# Patient Record
Sex: Female | Born: 1995 | Race: White | Hispanic: No | Marital: Single | Smoking: Never smoker
Health system: Southern US, Community
[De-identification: ages and names within clinical notes are randomized; demographics above are authoritative.]

## PROBLEM LIST (undated history)

## (undated) HISTORY — PX: WISDOM TOOTH EXTRACTION: SHX21

## (undated) HISTORY — PX: TONSILLECTOMY AND ADENOIDECTOMY: SUR1326

---
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
## 2017-10-11 ENCOUNTER — Ambulatory Visit (INDEPENDENT_AMBULATORY_CARE_PROVIDER_SITE_OTHER): Payer: 59 | Admitting: Obstetrics and Gynecology

## 2017-10-11 ENCOUNTER — Encounter: Payer: Self-pay | Admitting: Obstetrics and Gynecology

## 2017-10-11 VITALS — BP 100/60 | HR 77 | Ht 67.0 in | Wt 164.0 lb

## 2017-10-11 DIAGNOSIS — T8332XA Displacement of intrauterine contraceptive device, initial encounter: Secondary | ICD-10-CM | POA: Diagnosis not present

## 2017-10-11 LAB — POCT URINE PREGNANCY: Preg Test, Ur: NEGATIVE

## 2017-10-11 NOTE — Progress Notes (Signed)
  History of Present Illness:  Mckenzie Randolph is a 22 y.o. that had a IUD placed approximately 2 years ago. She has experience amenorrhea since that time. She was recently tested for STDs and the person performing her pelvic exam did not see the strings of the IUD. She was referred to find the strings of the IUD.  PMHx: She  has no past medical history on file. Also,  has a past surgical history that includes Wisdom tooth extraction., family history includes Cancer in her maternal grandfather; Heart Problems in her paternal grandfather.,  reports that  has never smoked. she has never used smokeless tobacco. She reports that she drinks alcohol. She reports that she does not use drugs. No outpatient medications have been marked as taking for the 10/11/17 encounter (Office Visit) with Mckenzie Randolph, Mckenzie Roskelley R, MD.  .  Also, has No Known Allergies..  Review of Systems  Constitutional: Negative for chills, fever, malaise/fatigue and weight loss.  HENT: Negative for congestion, hearing loss and sinus pain.   Eyes: Negative for blurred vision and double vision.  Respiratory: Negative for cough, sputum production, shortness of breath and wheezing.   Cardiovascular: Negative for chest pain, palpitations, orthopnea and leg swelling.  Gastrointestinal: Negative for abdominal pain, constipation, diarrhea, nausea and vomiting.  Genitourinary: Negative for dysuria, flank pain, frequency, hematuria and urgency.  Musculoskeletal: Negative for back pain, falls and joint pain.  Skin: Negative for itching and rash.  Neurological: Negative for dizziness and headaches.  Psychiatric/Behavioral: Negative for depression, substance abuse and suicidal ideas. The patient is not nervous/anxious.     Physical Exam:  BP 100/60   Pulse 77   Ht 5\' 7"  (1.702 m)   Wt 164 lb (74.4 kg)   LMP 09/25/2017   BMI 25.69 kg/m  Body mass index is 25.69 kg/m. Constitutional: Well nourished, well developed female in no acute distress.   Abdomen: diffusely non tender to palpation, non distended, and no masses, hernias Neuro: Grossly intact Psych:  Normal mood and affect.   Pelvic exam:  Two IUD strings absent from the cervical os. Could not tease strings with cytobrush.  EGBUS, vaginal vault and cervix: within normal limits  Assessment: IUD strings present in proper location; pt doing well  Plan: Urine pregnancy test today in office negative Will have patient return in 1 week for a transvaginal US to confirm location of IUD.  Mckenzie Idlerhristanna Else Habermann, MD [facility], [facility] 10/11/2017  7:00 PM

## 2017-10-25 ENCOUNTER — Ambulatory Visit (INDEPENDENT_AMBULATORY_CARE_PROVIDER_SITE_OTHER): Payer: 59 | Admitting: Obstetrics and Gynecology

## 2017-10-25 ENCOUNTER — Ambulatory Visit (INDEPENDENT_AMBULATORY_CARE_PROVIDER_SITE_OTHER): Payer: 59

## 2017-10-25 ENCOUNTER — Encounter: Payer: Self-pay | Admitting: Obstetrics and Gynecology

## 2017-10-25 VITALS — BP 100/64 | HR 70 | Ht 67.0 in | Wt 164.0 lb

## 2017-10-25 DIAGNOSIS — T8332XA Displacement of intrauterine contraceptive device, initial encounter: Secondary | ICD-10-CM | POA: Insufficient documentation

## 2017-10-25 DIAGNOSIS — T8332XD Displacement of intrauterine contraceptive device, subsequent encounter: Secondary | ICD-10-CM

## 2017-10-25 NOTE — Progress Notes (Signed)
  HPI:      Ms. Mckenzie Randolph is a 22 y.o. G0P0000 who LMP was Patient's last menstrual period was 09/25/2017., presents today for a problem visit.  Her IUD strings are lost. She had a pelvic US today which visualized the IUD properly placed inside the uterus.   PMHx: She  has no past medical history on file. Also,  has a past surgical history that includes Wisdom tooth extraction., family history includes Cancer in her maternal grandfather; Heart Problems in her paternal grandfather.,  reports that  has never smoked. she has never used smokeless tobacco. She reports that she drinks alcohol. She reports that she does not use drugs.  She  Current Outpatient Medications:  .  levonorgestrel (MIRENA) 20 MCG/24HR IUD, 1 each by Intrauterine route once., Disp: , Rfl:   Also, has No Known Allergies.  Review of Systems  Constitutional: Negative for chills, fever, malaise/fatigue and weight loss.  HENT: Negative for congestion, hearing loss and sinus pain.   Eyes: Negative for blurred vision and double vision.  Respiratory: Negative for cough, sputum production, shortness of breath and wheezing.   Cardiovascular: Negative for chest pain, palpitations, orthopnea and leg swelling.  Gastrointestinal: Negative for abdominal pain, constipation, diarrhea, nausea and vomiting.  Genitourinary: Negative for dysuria, flank pain, frequency, hematuria and urgency.  Musculoskeletal: Negative for back pain, falls and joint pain.  Skin: Negative for itching and rash.  Neurological: Negative for dizziness and headaches.  Psychiatric/Behavioral: Negative for depression, substance abuse and suicidal ideas. The patient is not nervous/anxious.     Objective: BP 100/64 (BP Location: Left Arm, Patient Position: Sitting, Cuff Size: Normal)   Pulse 70   Ht 5\' 7"  (1.702 m)   Wt 164 lb (74.4 kg)   LMP 09/25/2017   BMI 25.69 kg/m  Physical Exam  Constitutional: She is oriented to person, place, and time. She appears  well-developed.  Genitourinary: There is no lesion on the right labia. There is no lesion on the left labia. Vagina exhibits no lesion. Right adnexum does not display mass. Left adnexum does not display mass. Cervix does not exhibit motion tenderness.  HENT:  Head: Normocephalic and atraumatic.  Eyes: EOM are normal.  Neck: No thyromegaly present.  Cardiovascular: Normal rate.  Pulmonary/Chest: Right breast exhibits no inverted nipple, no mass, no nipple discharge and no skin change. Left breast exhibits no inverted nipple, no mass, no nipple discharge and no skin change.  Abdominal: Soft. Bowel sounds are normal. She exhibits no distension and no mass.  Neurological: She is alert and oriented to person, place, and time.  Skin: Skin is warm and dry.  Psychiatric: She has a normal mood and affect. Her behavior is normal. Judgment and thought content normal.  Nursing note and vitals reviewed.   ASSESSMENT/PLAN:  21yo with IUD strings lost, but IUD in place  Problem List Items Addressed This Visit      Other   IUD threads lost - Primary     IUD in place, will have patient return for removal of the IUD in 3 years when the device expires.  Adelene Idlerhristanna Clevie Prout MD [facility] 10/25/17 6:06 PM

## 2018-11-14 ENCOUNTER — Ambulatory Visit (INDEPENDENT_AMBULATORY_CARE_PROVIDER_SITE_OTHER): Payer: Managed Care, Other (non HMO) | Admitting: Obstetrics and Gynecology

## 2018-11-14 ENCOUNTER — Encounter: Payer: Self-pay | Admitting: Obstetrics and Gynecology

## 2018-11-14 ENCOUNTER — Other Ambulatory Visit (HOSPITAL_COMMUNITY)
Admission: RE | Admit: 2018-11-14 | Discharge: 2018-11-14 | Disposition: A | Discharge status: Home / Self Care | Payer: Managed Care, Other (non HMO) | Source: Ambulatory Visit | Attending: Obstetrics and Gynecology | Admitting: Obstetrics and Gynecology

## 2018-11-14 VITALS — BP 100/50 | HR 65 | Ht 67.0 in | Wt 183.0 lb

## 2018-11-14 DIAGNOSIS — R55 Syncope and collapse: Secondary | ICD-10-CM | POA: Diagnosis not present

## 2018-11-14 DIAGNOSIS — N760 Acute vaginitis: Secondary | ICD-10-CM

## 2018-11-14 DIAGNOSIS — Z124 Encounter for screening for malignant neoplasm of cervix: Secondary | ICD-10-CM | POA: Diagnosis present

## 2018-11-14 DIAGNOSIS — Z113 Encounter for screening for infections with a predominantly sexual mode of transmission: Secondary | ICD-10-CM

## 2018-11-14 MED ORDER — FLUCONAZOLE 150 MG PO TABS: 150.0000 mg | ORAL_TABLET | ORAL | 1 refills | Status: DC

## 2018-11-14 NOTE — Progress Notes (Signed)
Patient ID: Mckenzie Randolph, female   DOB: 1996/01/12, 23 y.o.   MRN: [id number]  Reason for Consult: Menstrual Problem (Gets a period when she works out not heavy but spotting, and having lightheadness )   Referred by No ref. provider found  Subjective:     HPI:  Mckenzie Randolph is a 23 y.o. female. She is following up today because she has been having a small amount of vaginal bleeding or spotting when she works out. She reports it also happens after intercourse. She is not filling pads but will see a small amount of pink when she wipes. She has also noticed some recent light headed ness while standing. She has not fainted or lost consciousness. She denies vision changes. She reports adequate hydration. She has not been missing meals or restricting her eating. She denies heart palpitations.  History reviewed. No pertinent past medical history. Family History  Problem Relation Age of Onset  . Cancer Maternal Grandfather   . Heart Problems Paternal Grandfather    Past Surgical History:  Procedure Laterality Date  . WISDOM TOOTH EXTRACTION      Short Social History:  Social History   Tobacco Use  . Smoking status: Never Smoker  . Smokeless tobacco: Never Used  Substance Use Topics  . Alcohol use: Yes    No Known Allergies  Current Outpatient Medications  Medication Sig Dispense Refill  . levonorgestrel (MIRENA) 20 MCG/24HR IUD 1 each by Intrauterine route once.     No current facility-administered medications for this visit.     Review of Systems  Constitutional: Negative for chills, fatigue, fever and unexpected weight change.  HENT: Negative for trouble swallowing.  Eyes: Negative for loss of vision.  Respiratory: Negative for cough, shortness of breath and wheezing.  Cardiovascular: Negative for chest pain, leg swelling, palpitations and syncope.  GI: Negative for abdominal pain, blood in stool, diarrhea, nausea and vomiting.  GU: Negative for difficulty  urinating, dysuria, frequency and hematuria.  Musculoskeletal: Negative for back pain, leg pain and joint pain.  Skin: Negative for rash.  Neurological: Negative for dizziness, headaches, light-headedness, numbness and seizures.  Psychiatric: Negative for behavioral problem, confusion, depressed mood and sleep disturbance.        Objective:  Objective   Vitals:   11/14/18 0817  BP: (!) 100/50  Pulse: 65  Weight: 183 lb (83 kg)  Height: 5\' 7"  (1.702 m)   Body mass index is 28.66 kg/m.  Physical Exam Vitals signs and nursing note reviewed.  Constitutional:      Appearance: She is well-developed.  HENT:     Head: Normocephalic and atraumatic.  Eyes:     Pupils: Pupils are equal, round, and reactive to light.  Cardiovascular:     Rate and Rhythm: Normal rate and regular rhythm.  Pulmonary:     Effort: Pulmonary effort is normal. No respiratory distress.  Abdominal:     General: Abdomen is flat.     Palpations: Abdomen is soft.  Genitourinary:    Comments: Strings not seen, could not be teased with the cyto brush loose. Normal discharge. No active bleeding seen. Skin:    General: Skin is warm and dry.  Neurological:     Mental Status: She is alert and oriented to person, place, and time.  Psychiatric:        Behavior: Behavior normal.        Thought Content: Thought content normal.        Judgment: Judgment normal.  Assessment/Plan:     23 yo with vaginal bleeding with IUD 1. Will send Nuswab for vaginitis 2. Pap smear today, 2017 LSIL pap 3. Reassurance about spotting 4. CBC and BMP for faintness, orthostatic vitals today in office normal. Advised to follow with PCP if symptoms persist.  More than 15 minutes were spent face to face with the patient in the room with more than 50% of the time spent providing counseling and discussing the plan of management.   Adelene Idler MD [facility], [facility] 11/14/2018 6:04 PM

## 2018-11-15 LAB — CBC
Hematocrit: 45.7 % (ref 34.0–46.6)
Hemoglobin: 15.9 g/dL (ref 11.1–15.9)
MCH: 31.7 pg (ref 26.6–33.0)
MCHC: 34.8 g/dL (ref 31.5–35.7)
MCV: 91 fL (ref 79–97)
PLATELETS: 208 10*3/uL (ref 150–450)
RBC: 5.02 x10E6/uL (ref 3.77–5.28)
RDW: 12.4 % (ref 11.7–15.4)
WBC: 5.4 10*3/uL (ref 3.4–10.8)

## 2018-11-15 LAB — BASIC METABOLIC PANEL
BUN / CREAT RATIO: 18 (ref 9–23)
BUN: 15 mg/dL (ref 6–20)
CO2: 28 mmol/L (ref 20–29)
Calcium: 9.9 mg/dL (ref 8.7–10.2)
Chloride: 101 mmol/L (ref 96–106)
Creatinine, Ser: 0.84 mg/dL (ref 0.57–1.00)
GFR calc Af Amer: 113 mL/min/{1.73_m2} (ref >59)
GFR calc non Af Amer: 98 mL/min/{1.73_m2} (ref >59)
GLUCOSE: 81 mg/dL (ref 65–99)
POTASSIUM: 4.4 mmol/L (ref 3.5–5.2)
SODIUM: 141 mmol/L (ref 134–144)

## 2018-11-15 NOTE — Progress Notes (Signed)
Negative, Released to mychart 

## 2018-11-19 LAB — NUSWAB VAGINITIS PLUS (VG+)
CANDIDA GLABRATA, NAA: NEGATIVE
Candida albicans, NAA: NEGATIVE
Chlamydia trachomatis, NAA: NEGATIVE
Neisseria gonorrhoeae, NAA: NEGATIVE
Trich vag by NAA: NEGATIVE

## 2018-11-19 NOTE — Progress Notes (Signed)
Negative, Released to mychart 

## 2018-11-20 LAB — CYTOLOGY - PAP: Diagnosis: NEGATIVE

## 2019-06-17 ENCOUNTER — Ambulatory Visit: Payer: Managed Care, Other (non HMO) | Admitting: Obstetrics and Gynecology

## 2019-07-24 ENCOUNTER — Ambulatory Visit (INDEPENDENT_AMBULATORY_CARE_PROVIDER_SITE_OTHER): Payer: Managed Care, Other (non HMO) | Admitting: Obstetrics and Gynecology

## 2019-07-24 ENCOUNTER — Encounter: Payer: Self-pay | Admitting: Obstetrics and Gynecology

## 2019-07-24 ENCOUNTER — Other Ambulatory Visit: Payer: Self-pay

## 2019-07-24 VITALS — BP 132/88 | Ht 67.0 in | Wt 172.0 lb

## 2019-07-24 DIAGNOSIS — F419 Anxiety disorder, unspecified: Secondary | ICD-10-CM

## 2019-07-24 DIAGNOSIS — Z01419 Encounter for gynecological examination (general) (routine) without abnormal findings: Secondary | ICD-10-CM

## 2019-07-24 DIAGNOSIS — Z0000 Encounter for general adult medical examination without abnormal findings: Secondary | ICD-10-CM

## 2019-07-24 DIAGNOSIS — F32A Depression, unspecified: Secondary | ICD-10-CM

## 2019-07-24 DIAGNOSIS — F329 Major depressive disorder, single episode, unspecified: Secondary | ICD-10-CM

## 2019-07-24 MED ORDER — CITALOPRAM HYDROBROMIDE 20 MG PO TABS: 20.0000 mg | ORAL_TABLET | Freq: Every day | ORAL | 12 refills | Status: DC

## 2019-07-24 NOTE — Patient Instructions (Addendum)
Living With Anxiety  After being diagnosed with an anxiety disorder, you may be relieved to know why you have felt or behaved a certain way. It is natural to also feel overwhelmed about the treatment ahead and what it will mean for your life. With care and support, you can manage this condition and recover from it. How to cope with anxiety Dealing with stress Stress is your body's reaction to life changes and events, both good and bad. Stress can last just a few hours or it can be ongoing. Stress can play a major role in anxiety, so it is important to learn both how to cope with stress and how to think about it differently. Talk with your health care provider or a counselor to learn more about stress reduction. He or she may suggest some stress reduction techniques, such as:  Music therapy. This can include creating or listening to music that you enjoy and that inspires you.  Mindfulness-based meditation. This involves being aware of your normal breaths, rather than trying to control your breathing. It can be done while sitting or walking.  Centering prayer. This is a kind of meditation that involves focusing on a word, phrase, or sacred image that is meaningful to you and that brings you peace.  Deep breathing. To do this, expand your stomach and inhale slowly through your nose. Hold your breath for 3-5 seconds. Then exhale slowly, allowing your stomach muscles to relax.  Self-talk. This is a skill where you identify thought patterns that lead to anxiety reactions and correct those thoughts.  Muscle relaxation. This involves tensing muscles then relaxing them. Choose a stress reduction technique that fits your lifestyle and personality. Stress reduction techniques take time and practice. Set aside 5-15 minutes a day to do them. Therapists can offer training in these techniques. The training may be covered by some insurance plans. Other things you can do to manage stress include:  Keeping a  stress diary. This can help you learn what triggers your stress and ways to control your response.  Thinking about how you respond to certain situations. You may not be able to control everything, but you can control your reaction.  Making time for activities that help you relax, and not feeling guilty about spending your time in this way. Therapy combined with coping and stress-reduction skills provides the best chance for successful treatment. Medicines Medicines can help ease symptoms. Medicines for anxiety include:  Anti-anxiety drugs.  Antidepressants.  Beta-blockers. Medicines may be used as the main treatment for anxiety disorder, along with therapy, or if other treatments are not working. Medicines should be prescribed by a health care provider. Relationships Relationships can play a big part in helping you recover. Try to spend more time connecting with trusted friends and family members. Consider going to couples counseling, taking family education classes, or going to family therapy. Therapy can help you and others better understand the condition. How to recognize changes in your condition Everyone has a different response to treatment for anxiety. Recovery from anxiety happens when symptoms decrease and stop interfering with your daily activities at home or work. This may mean that you will start to:  Have better concentration and focus.  Sleep better.  Be less irritable.  Have more energy.  Have improved memory. It is important to recognize when your condition is getting worse. Contact your health care provider if your symptoms interfere with home or work and you do not feel like your condition is improving. Where   to find help and support: You can get help and support from these sources:  Self-help groups.  Online and community organizations.  A trusted spiritual leader.  Couples counseling.  Family education classes.  Family therapy. Follow these instructions  at home:  Eat a healthy diet that includes plenty of vegetables, fruits, whole grains, low-fat dairy products, and lean protein. Do not eat a lot of foods that are high in solid fats, added sugars, or salt.  Exercise. Most adults should do the following: ? Exercise for at least 150 minutes each week. The exercise should increase your heart rate and make you sweat (moderate-intensity exercise). ? Strengthening exercises at least twice a week.  Cut down on caffeine, tobacco, alcohol, and other potentially harmful substances.  Get the right amount and quality of sleep. Most adults need 7-9 hours of sleep each night.  Make choices that simplify your life.  Take over-the-counter and prescription medicines only as told by your health care provider.  Avoid caffeine, alcohol, and certain over-the-counter cold medicines. These may make you feel worse. Ask your pharmacist which medicines to avoid.  Keep all follow-up visits as told by your health care provider. This is important. Questions to ask your health care provider  Would I benefit from therapy?  How often should I follow up with a health care provider?  How long do I need to take medicine?  Are there any long-term side effects of my medicine?  Are there any alternatives to taking medicine? Contact a health care provider if:  You have a hard time staying focused or finishing daily tasks.  You spend many hours a day feeling worried about everyday life.  You become exhausted by worry.  You start to have headaches, feel tense, or have nausea.  You urinate more than normal.  You have diarrhea. Get help right away if:  You have a racing heart and shortness of breath.  You have thoughts of hurting yourself or others. If you ever feel like you may hurt yourself or others, or have thoughts about taking your own life, get help right away. You can go to your nearest emergency department or call:  Your local emergency services  (911 in the [place]  A suicide crisis helpline, such as the National Suicide Prevention Lifeline at 1-800-273-8255. This is open 24-hours a day. Summary  Taking steps to deal with stress can help calm you.  Medicines cannot cure anxiety disorders, but they can help ease symptoms.  Family, friends, and partners can play a big part in helping you recover from an anxiety disorder. This information is not intended to replace advice given to you by your health care provider. Make sure you discuss any questions you have with your health care provider. Document Released: 08/22/2016 Document Revised: 08/10/2017 Document Reviewed: 08/22/2016 Elsevier Patient Education  2020 [employer] Generalized Anxiety Disorder, Adult Generalized anxiety disorder (GAD) is a mental health disorder. People with this condition constantly worry about everyday events. Unlike normal anxiety, worry related to GAD is not triggered by a specific event. These worries also do not fade or get better with time. GAD interferes with life functions, including relationships, work, and school. GAD can vary from mild to severe. People with severe GAD can have intense waves of anxiety with physical symptoms (panic attacks). What are the causes? The exact cause of GAD is not known. What increases the risk? This condition is more likely to develop in:  Women.  People who have a family history   of anxiety disorders.  People who are very shy.  People who experience very stressful life events, such as the death of a loved one.  People who have a very stressful family environment. What are the signs or symptoms? People with GAD often worry excessively about many things in their lives, such as their health and family. They may also be overly concerned about:  Doing well at work.  Being on time.  Natural disasters.  Friendships. Physical symptoms of GAD include:  Fatigue.  Muscle tension or having muscle twitches.   Trembling or feeling shaky.  Being easily startled.  Feeling like your heart is pounding or racing.  Feeling out of breath or like you cannot take a deep breath.  Having trouble falling asleep or staying asleep.  Sweating.  Nausea, diarrhea, or irritable bowel syndrome (IBS).  Headaches.  Trouble concentrating or remembering facts.  Restlessness.  Irritability. How is this diagnosed? Your health care provider can diagnose GAD based on your symptoms and medical history. You will also have a physical exam. The health care provider will ask specific questions about your symptoms, including how severe they are, when they started, and if they come and go. Your health care provider may ask you about your use of alcohol or drugs, including prescription medicines. Your health care provider may refer you to a mental health specialist for further evaluation. Your health care provider will do a thorough examination and may perform additional tests to rule out other possible causes of your symptoms. To be diagnosed with GAD, a person must have anxiety that:  Is out of his or her control.  Affects several different aspects of his or her life, such as work and relationships.  Causes distress that makes him or her unable to take part in normal activities.  Includes at least three physical symptoms of GAD, such as restlessness, fatigue, trouble concentrating, irritability, muscle tension, or sleep problems. Before your health care provider can confirm a diagnosis of GAD, these symptoms must be present more days than they are not, and they must last for six months or longer. How is this treated? The following therapies are usually used to treat GAD:  Medicine. Antidepressant medicine is usually prescribed for long-term daily control. Antianxiety medicines may be added in severe cases, especially when panic attacks occur.  Talk therapy (psychotherapy). Certain types of talk therapy can be  helpful in treating GAD by providing support, education, and guidance. Options include: ? Cognitive behavioral therapy (CBT). People learn coping skills and techniques to ease their anxiety. They learn to identify unrealistic or negative thoughts and behaviors and to replace them with positive ones. ? Acceptance and commitment therapy (ACT). This treatment teaches people how to be mindful as a way to cope with unwanted thoughts and feelings. ? Biofeedback. This process trains you to manage your body's response (physiological response) through breathing techniques and relaxation methods. You will work with a therapist while machines are used to monitor your physical symptoms.  Stress management techniques. These include yoga, meditation, and exercise. A mental health specialist can help determine which treatment is best for you. Some people see improvement with one type of therapy. However, other people require a combination of therapies. Follow these instructions at home:  Take over-the-counter and prescription medicines only as told by your health care provider.  Try to maintain a normal routine.  Try to anticipate stressful situations and allow extra time to manage them.  Practice any stress management or self-calming techniques as  taught by your health care provider.  Do not punish yourself for setbacks or for not making progress.  Try to recognize your accomplishments, even if they are small.  Keep all follow-up visits as told by your health care provider. This is important. Contact a health care provider if:  Your symptoms do not get better.  Your symptoms get worse.  You have signs of depression, such as: ? A persistently sad, cranky, or irritable mood. ? Loss of enjoyment in activities that used to bring you joy. ? Change in weight or eating. ? Changes in sleeping habits. ? Avoiding friends or family members. ? Loss of energy for normal tasks. ? Feelings of guilt or  worthlessness. Get help right away if:  You have serious thoughts about hurting yourself or others. If you ever feel like you may hurt yourself or others, or have thoughts about taking your own life, get help right away. You can go to your nearest emergency department or call:  Your local emergency services (911 in the [place]  A suicide crisis helpline, such as the [facility] at 726-195-4080. This is open 24 hours a day. Summary  Generalized anxiety disorder (GAD) is a mental health disorder that involves worry that is not triggered by a specific event.  People with GAD often worry excessively about many things in their lives, such as their health and family.  GAD may cause physical symptoms such as restlessness, trouble concentrating, sleep problems, frequent sweating, nausea, diarrhea, headaches, and trembling or muscle twitching.  A mental health specialist can help determine which treatment is best for you. Some people see improvement with one type of therapy. However, other people require a combination of therapies. This information is not intended to replace advice given to you by your health care provider. Make sure you discuss any questions you have with your health care provider. Document Released: 12/23/2012 Document Revised: 08/10/2017 Document Reviewed: 07/18/2016 Elsevier Patient Education  2020 [place], [place] Same Day Access Hours:  Monday, Wednesday and Friday, 8am - 3pm Walk-In Crisis Hours: 7 days/wk, 8am - 8pm [address] [place], [place] [address] [phone]  [employer] [phone]  Mobile Crisis Unit [phone]   Therapists/Counselors/Psychologists    [facility] [place], [place] [address] Call [address] Terrance Mass [phone]  Karen [place], [place]    [phone]        [place], [place] [address]        Peggye Ley, [place] [phone] [place], [place] [address]  Lady Deutscher, [place]        [phone]        [address]      [place], [place] [address]        Bettey Mare, [place] [phone] [address] [place], [place] [address]   Dimas Aguas, LMFT       [phone]        [address] [place], [place] [address]        Miguel Dibble [phone] [address] [place], [place] [address]  Rhona Raider        [phone]        [address]       [place], [place] [address]  Kerin Salen [phone] [address] [place], [place] [address]  Tyron Russell, PsyD       [phone]        [address]       [place], [place] [address]        Elita Quick, LPC [phone] [address]  [place], [place] [address]   Debarah Crape        [phone]        [address] [place], [place] [address]         Rosana Hoes [facility] [phone] [email or web address]   Sation Konchella       [phone]        [address]       [place], [place] [address]        [facility] [phone] [email or web address]

## 2019-07-24 NOTE — Progress Notes (Signed)
Gynecology Annual Exam  PCP: Patient, No Pcp Per  Chief Complaint:  Chief Complaint  Patient presents with  . Gynecologic Exam    History of Present Illness: Patient is a 23 y.o. G0P0000 presents for annual exam. The patient has no complaints today.   LMP: No LMP recorded. (Menstrual status: IUD). No further bleeding or spotting since march 2020. She is happy with IUD.   The patient is sexually active. She currently uses IUD for contraception. She denies dyspareunia.  The patient does not perform self breast exams.  There is no notable family history of breast or ovarian cancer in her family.  The patient wears seatbelts: yes.  The patient has regular exercise: yes.    The patient reports current symptoms of depression and anxiety. She has been having trouble sleeping and is getting mad at her significant other easily.    Review of Systems: Review of Systems  Constitutional: Negative for chills, fever, malaise/fatigue and weight loss.  HENT: Negative for congestion, hearing loss and sinus pain.   Eyes: Negative for blurred vision and double vision.  Respiratory: Negative for cough, sputum production, shortness of breath and wheezing.   Cardiovascular: Negative for chest pain, palpitations, orthopnea and leg swelling.  Gastrointestinal: Negative for abdominal pain, constipation, diarrhea, nausea and vomiting.  Genitourinary: Negative for dysuria, flank pain, frequency, hematuria and urgency.  Musculoskeletal: Negative for back pain, falls and joint pain.  Skin: Negative for itching and rash.  Neurological: Negative for dizziness and headaches.  Psychiatric/Behavioral: Positive for depression. Negative for substance abuse and suicidal ideas. The patient is nervous/anxious.     Past Medical History:  History reviewed. No pertinent past medical history.  Past Surgical History:  Past Surgical History:  Procedure Laterality Date  . TONSILLECTOMY AND ADENOIDECTOMY     Age 80-8   . WISDOM TOOTH EXTRACTION      Gynecologic History:  No LMP recorded. (Menstrual status: IUD). Contraception: IUD Last Pap: Results were: 11/2018 NIL   Obstetric History: G0P0000  Family History:  Family History  Problem Relation Age of Onset  . Cancer Maternal Grandfather   . Heart Problems Paternal Grandfather     Social History:  Social History   Socioeconomic History  . Marital status: Single    Spouse name: Not on file  . Number of children: Not on file  . Years of education: Not on file  . Highest education level: Not on file  Occupational History  . Not on file  Social Needs  . Financial resource strain: Not on file  . Food insecurity    Worry: Not on file    Inability: Not on file  . Transportation needs    Medical: Not on file    Non-medical: Not on file  Tobacco Use  . Smoking status: Never Smoker  . Smokeless tobacco: Never Used  Substance and Sexual Activity  . Alcohol use: Yes    Comment: social  . Drug use: No  . Sexual activity: Yes    Birth control/protection: I.U.D.  Lifestyle  . Physical activity    Days per week: Not on file    Minutes per session: Not on file  . Stress: Not on file  Relationships  . Social Musician on phone: Not on file    Gets together: Not on file    Attends religious service: Not on file    Active member of club or organization: Not on file    Attends  meetings of clubs or organizations: Not on file    Relationship status: Not on file  . Intimate partner violence    Fear of current or ex partner: Not on file    Emotionally abused: Not on file    Physically abused: Not on file    Forced sexual activity: Not on file  Other Topics Concern  . Not on file  Social History Narrative  . Not on file    Allergies:  No Known Allergies  Medications: Prior to Admission medications   Medication Sig Start Date End Date Taking? Authorizing Provider  levonorgestrel (MIRENA) 20 MCG/24HR IUD 1 each by  Intrauterine route once.   Yes [provider]  citalopram (CELEXA) 20 MG tablet Take 1 tablet (20 mg total) by mouth daily. 07/24/19   Homero Fellers, MD    Physical Exam Vitals: Blood pressure 132/88, height 5\' 7"  (1.702 m), weight 172 lb (78 kg).  General: NAD HEENT: normocephalic, anicteric Thyroid: no enlargement, no palpable nodules Pulmonary: No increased work of breathing, CTAB Cardiovascular: RRR, distal pulses 2+ Breast: Breast symmetrical, no tenderness, no palpable nodules or masses, no skin or nipple retraction present, no nipple discharge.  No axillary or supraclavicular lymphadenopathy. Abdomen: NABS, soft, non-tender, non-distended.  Umbilicus without lesions.  No hepatomegaly, splenomegaly or masses palpable. No evidence of hernia  Genitourinary:  External: Normal external female genitalia.  Normal urethral meatus, normal Bartholin's and Skene's glands.    Vagina: Normal vaginal mucosa, no evidence of prolapse.    Cervix: Grossly normal in appearance, no bleeding  Uterus: Non-enlarged, mobile, normal contour.  No CMT  Adnexa: ovaries non-enlarged, no adnexal masses  Rectal: deferred  Lymphatic: no evidence of inguinal lymphadenopathy Extremities: no edema, erythema, or tenderness Neurologic: Grossly intact Psychiatric: mood appropriate, affect full  Female chaperone present for pelvic and breast  portions of the physical exam    Assessment: 23 y.o. G0P0000 routine annual exam  Plan: Problem List Items Addressed This Visit    None    Visit Diagnoses    Anxiety and depression    -  Primary   Relevant Medications   citalopram (CELEXA) 20 MG tablet   Healthcare maintenance          1) Gardasil Series discussed and if applicable offered to patient - Patient has previously completed 3 shot series   2) STI screening  was offered and declined  3)  ASCCP guidelines and rational discussed.  Patient opts for every 3 years screening interval  4)  Contraception - the patient is currently using  IUD.  She is happy with her current form of contraception and plans to continue We discussed safe sex practices to reduce her furture risk of STI's.    6) Anxiety- will start celexa 20mg  q day- discussed options for therapy. Will follow up in 1 month. GAD-7 and PHQ-9 completed  5) Return in about 1 month (around 08/23/2019) for Telephone visit.

## 2019-08-28 ENCOUNTER — Ambulatory Visit (INDEPENDENT_AMBULATORY_CARE_PROVIDER_SITE_OTHER): Payer: Managed Care, Other (non HMO) | Admitting: Obstetrics and Gynecology

## 2019-08-28 ENCOUNTER — Other Ambulatory Visit: Payer: Self-pay

## 2019-08-28 ENCOUNTER — Encounter: Payer: Self-pay | Admitting: Obstetrics and Gynecology

## 2019-08-28 DIAGNOSIS — F329 Major depressive disorder, single episode, unspecified: Secondary | ICD-10-CM | POA: Diagnosis not present

## 2019-08-28 DIAGNOSIS — F419 Anxiety disorder, unspecified: Secondary | ICD-10-CM

## 2019-08-28 DIAGNOSIS — F32A Depression, unspecified: Secondary | ICD-10-CM

## 2019-08-28 MED ORDER — CITALOPRAM HYDROBROMIDE 40 MG PO TABS: 40.0000 mg | ORAL_TABLET | Freq: Every day | ORAL | 11 refills | Status: AC

## 2019-08-28 NOTE — Progress Notes (Signed)
Virtual Visit via Telephone Note  I connected with Mckenzie Randolph on 09/08/19 at  3:10 PM EST by telephone and verified that I am speaking with the correct person using two identifiers.   I discussed the limitations, risks, security and privacy concerns of performing an evaluation and management service by telephone and the availability of in person appointments. I also discussed with the patient that there may be a patient responsible charge related to this service. The patient expressed understanding and agreed to proceed.  The patient was at home I spoke with the patient from my  office The names of people involved in this encounter were: Dr. Gilman Schmidt and [facility].   History of Present Illness: She has been taking Celexa and has been happy with the results. She reports that her anxiety has improved.She has not started therapy yet, but  will continue to consider.  Feels like she is coping some. Has a small amount of anxiety in the morning but then it improves throughout the day. She is overall happy with the medication and would like to continue.    Observations/Objective: Physical Exam could not be performed. Because of the COVID-19 outbreak this visit was performed over the phone and not in person.   Assessment and Plan: 23 yo with anxiety and depression Improved with current SSRI therapy. Continue to monitor.   Follow Up Instructions:  Yearly follow up planned.    I discussed the assessment and treatment plan with the patient. The patient was provided an opportunity to ask questions and all were answered. The patient agreed with the plan and demonstrated an understanding of the instructions.   The patient was advised to call back or seek an in-person evaluation if the symptoms worsen or if the condition fails to improve as anticipated.  I provided 12 minutes of non-face-to-face time during this encounter.   Adrian Prows MD [facility] [place]  [facility] 08/28/2019 4:25 PM

## 2019-09-08 ENCOUNTER — Encounter: Payer: Self-pay | Admitting: Obstetrics and Gynecology

## 2020-04-26 ENCOUNTER — Telehealth: Payer: Self-pay | Admitting: Obstetrics and Gynecology

## 2020-04-26 NOTE — Telephone Encounter (Signed)
Noted. Will order to arrive by apt date/time. 

## 2020-04-26 NOTE — Telephone Encounter (Signed)
Patient is scheduled for 05/21/20 at 1:30 for mirena replacement with CRS

## 2020-05-20 NOTE — Telephone Encounter (Signed)
Per pt cancel appointment will call to reschedule

## 2020-05-21 ENCOUNTER — Ambulatory Visit: Payer: Managed Care, Other (non HMO) | Admitting: Obstetrics and Gynecology
